# Patient Record
Sex: Male | Born: 1955 | Race: White | Hispanic: No | Marital: Married | Smoking: Never smoker
Health system: Southern US, Community
[De-identification: ages and names within clinical notes are randomized; demographics above are authoritative.]

## PROBLEM LIST (undated history)

## (undated) DIAGNOSIS — I10 Essential (primary) hypertension: Secondary | ICD-10-CM

## (undated) DIAGNOSIS — E785 Hyperlipidemia, unspecified: Secondary | ICD-10-CM

## (undated) DIAGNOSIS — E119 Type 2 diabetes mellitus without complications: Secondary | ICD-10-CM

## (undated) HISTORY — PX: JOINT REPLACEMENT: SHX530

---

## 1998-02-23 ENCOUNTER — Ambulatory Visit (HOSPITAL_COMMUNITY): Admission: RE | Admit: 1998-02-23 | Discharge: 1998-02-23 | Payer: Self-pay | Admitting: Endocrinology

## 1998-03-02 ENCOUNTER — Ambulatory Visit (HOSPITAL_COMMUNITY): Admission: RE | Admit: 1998-03-02 | Discharge: 1998-03-02 | Payer: Self-pay | Admitting: Endocrinology

## 2011-05-29 ENCOUNTER — Encounter: Payer: Self-pay | Admitting: Cardiovascular Disease

## 2015-03-28 ENCOUNTER — Emergency Department
Admission: EM | Admit: 2015-03-28 | Discharge: 2015-03-28 | Disposition: A | Discharge status: Home / Self Care | Payer: Commercial Managed Care - HMO | Attending: Emergency Medicine | Admitting: Emergency Medicine

## 2015-03-28 ENCOUNTER — Emergency Department: Payer: Commercial Managed Care - HMO

## 2015-03-28 DIAGNOSIS — Y92512 Supermarket, store or market as the place of occurrence of the external cause: Secondary | ICD-10-CM | POA: Insufficient documentation

## 2015-03-28 DIAGNOSIS — Y9389 Activity, other specified: Secondary | ICD-10-CM | POA: Insufficient documentation

## 2015-03-28 DIAGNOSIS — S3992XA Unspecified injury of lower back, initial encounter: Secondary | ICD-10-CM | POA: Diagnosis present

## 2015-03-28 DIAGNOSIS — W010XXA Fall on same level from slipping, tripping and stumbling without subsequent striking against object, initial encounter: Secondary | ICD-10-CM | POA: Insufficient documentation

## 2015-03-28 DIAGNOSIS — S80212A Abrasion, left knee, initial encounter: Secondary | ICD-10-CM | POA: Diagnosis not present

## 2015-03-28 DIAGNOSIS — S39012A Strain of muscle, fascia and tendon of lower back, initial encounter: Secondary | ICD-10-CM | POA: Insufficient documentation

## 2015-03-28 DIAGNOSIS — Y998 Other external cause status: Secondary | ICD-10-CM | POA: Insufficient documentation

## 2015-03-28 DIAGNOSIS — I10 Essential (primary) hypertension: Secondary | ICD-10-CM | POA: Diagnosis not present

## 2015-03-28 DIAGNOSIS — E119 Type 2 diabetes mellitus without complications: Secondary | ICD-10-CM | POA: Diagnosis not present

## 2015-03-28 DIAGNOSIS — M25462 Effusion, left knee: Secondary | ICD-10-CM

## 2015-03-28 HISTORY — DX: Hyperlipidemia, unspecified: E78.5

## 2015-03-28 HISTORY — DX: Type 2 diabetes mellitus without complications: E11.9

## 2015-03-28 HISTORY — DX: Essential (primary) hypertension: I10

## 2015-03-28 MED ORDER — TRAMADOL HCL 50 MG PO TABS
100.0000 mg | ORAL_TABLET | Freq: Four times a day (QID) | ORAL | Status: AC | PRN
Start: 2015-03-28 — End: 2016-03-27

## 2015-03-28 MED ORDER — TRAMADOL HCL 50 MG PO TABS
100.0000 mg | ORAL_TABLET | Freq: Once | ORAL | Status: AC
  Administered 2015-03-28: 100 mg via ORAL
  Filled 2015-03-28: qty 2

## 2015-03-28 NOTE — ED Provider Notes (Signed)
CSN: [id number]     Arrival date & time 03/28/15  0912 History   First MD Initiated Contact with Patient 03/28/15 [phone]     Chief Complaint  Patient presents with  . Fall    HPI Comments: 59 year old male presents today complaining of left knee and low back pain secondary to fall that occurred at [facility] just prior to arrival. Pt was transported here by EMS after slipping on what he thinks was lotion in the floor in [facility] and falling onto his left knee. He had a knee replacement on this knee in March at the [facility]. He is also having some back pain on the right side he attributes to the fall. He was able to get up on his own and stand after the fall without assistance.   Patient is a 59 y.o. male presenting with fall. The history is provided by the patient.  Fall This is a new problem. The current episode started today. The problem has been unchanged. Associated symptoms include arthralgias, joint swelling and myalgias. The symptoms are aggravated by standing and walking. He has tried nothing for the symptoms.    Past Medical History  Diagnosis Date  . Hypertension   . Diabetes mellitus without complication   . Hyperlipemia    Past Surgical History  Procedure Laterality Date  . Joint replacement      BL knee   No family history on file. Social History  Substance Use Topics  . Smoking status: Never Smoker   . Smokeless tobacco: Current User  . Alcohol Use: No    Review of Systems  Musculoskeletal: Positive for myalgias, joint swelling and arthralgias. Negative for gait problem.  Skin: Positive for wound.  All other systems reviewed and are negative.     Allergies  Bee pollen  Home Medications   Prior to Admission medications   Medication Sig Start Date End Date Taking? Authorizing Provider  traMADol (ULTRAM) 50 MG tablet Take 2 tablets (100 mg total) by mouth every 6 (six) hours as needed for moderate pain. 03/28/15 03/27/16  Wilber Oliphant V, PA-C   BP 144/81 mmHg  Pulse  65  Resp 18  Ht 6' (1.829 m)  Wt 294 lb (133.358 kg)  BMI 39.86 kg/m2  SpO2 96% Physical Exam  Constitutional: He is oriented to person, place, and time. Vital signs are normal. He appears well-developed and well-nourished.  HENT:  Head: Normocephalic and atraumatic.  Cardiovascular: Intact distal pulses.   Musculoskeletal: He exhibits tenderness.       Left hip: Normal.       Right knee: Normal.       Left knee: He exhibits decreased range of motion, swelling and effusion. He exhibits no deformity. Tenderness found. Medial joint line tenderness noted.       Left ankle: Normal.       Lumbar back: He exhibits tenderness and bony tenderness. He exhibits normal range of motion.  Diffuse lumbar spine tenderness and right lumbar paraspinal muscle tenderness.  Left knee TTP over patella and medial joint line  Neurological: He is alert and oriented to person, place, and time.  Skin: Skin is warm and dry.  Abrasion overlying left patella  Psychiatric: He has a normal mood and affect. His behavior is normal. Judgment and thought content normal.  Nursing note and vitals reviewed.   ED Course  Procedures (including critical care time) Labs Review Labs Reviewed - No data to display  Imaging Review Dg Lumbar Spine 2-3  Views  03/28/2015   CLINICAL DATA:  Fall.  Acute lumbago/low back pain.  EXAM: LUMBAR SPINE - 2-3 VIEW  COMPARISON:  None.  FINDINGS: Lumbosacral transitional anatomy is present. There is no compression fracture. Moderate multilevel lumbar degenerative disc disease. The alignment is within normal limits.  IMPRESSION: No acute osseous abnormality. Moderate lumbar degenerative disc disease.   Electronically Signed   By: Andreas Newport M.D.   On: 03/28/2015 10:17   Dg Knee Complete 4 Views Left  03/28/2015   CLINICAL DATA:  Acute left knee pain after fall in [employer]. Initial encounter.  EXAM: LEFT KNEE - COMPLETE 4+ VIEW  COMPARISON:  None.  FINDINGS: Status post left total knee  arthroplasty. The femoral and tibial components appear to be well situated. No fracture or dislocation is noted. Mild suprapatellar joint effusion is noted.  IMPRESSION: Status post left total knee arthroplasty. Mild suprapatellar joint effusion. No other significant abnormality seen in the left knee.   Electronically Signed   By: Lupita Raider, M.D.   On: 03/28/2015 10:15   I have personally reviewed and evaluated these images and lab results as part of my medical decision-making.   EKG Interpretation None      MDM  XRAYs normal - keep knee elevated, ice 15 minutes at a time. Short course of ultram, pt has taken this before for pain. Follow up with orthopedist at [facility] for persistent knee or back problems.  Final diagnoses:  Lumbar strain, initial encounter  Abrasion of left knee, initial encounter  Knee effusion, left       Wilber Oliphant V, PA-C 03/28/15 1111  Sharyn Creamer, MD 03/28/15 [phone]

## 2015-03-28 NOTE — ED Notes (Addendum)
Pt comes into the ED via EMS from [facility], states he slipped and fell due to lotion on the floor and is having left knee pain and lower back pain.the patient ambulatory with assist per EMS.the patient just had knee surgery in march.

## 2018-02-10 ENCOUNTER — Emergency Department
Admission: EM | Admit: 2018-02-10 | Discharge: 2018-02-11 | Disposition: A | Discharge status: Home / Self Care | Payer: No Typology Code available for payment source | Attending: Student in an Organized Health Care Education/Training Program | Admitting: Student in an Organized Health Care Education/Training Program

## 2018-02-10 ENCOUNTER — Other Ambulatory Visit: Payer: Self-pay

## 2018-02-10 ENCOUNTER — Encounter: Payer: Self-pay | Admitting: Radiology

## 2018-02-10 ENCOUNTER — Emergency Department: Payer: No Typology Code available for payment source

## 2018-02-10 DIAGNOSIS — R1032 Left lower quadrant pain: Secondary | ICD-10-CM | POA: Diagnosis present

## 2018-02-10 DIAGNOSIS — I10 Essential (primary) hypertension: Secondary | ICD-10-CM | POA: Insufficient documentation

## 2018-02-10 DIAGNOSIS — R17 Unspecified jaundice: Secondary | ICD-10-CM

## 2018-02-10 DIAGNOSIS — Z96652 Presence of left artificial knee joint: Secondary | ICD-10-CM | POA: Diagnosis not present

## 2018-02-10 DIAGNOSIS — F17228 Nicotine dependence, chewing tobacco, with other nicotine-induced disorders: Secondary | ICD-10-CM | POA: Insufficient documentation

## 2018-02-10 DIAGNOSIS — E119 Type 2 diabetes mellitus without complications: Secondary | ICD-10-CM | POA: Insufficient documentation

## 2018-02-10 DIAGNOSIS — R110 Nausea: Secondary | ICD-10-CM | POA: Diagnosis not present

## 2018-02-10 DIAGNOSIS — K8020 Calculus of gallbladder without cholecystitis without obstruction: Secondary | ICD-10-CM

## 2018-02-10 DIAGNOSIS — R109 Unspecified abdominal pain: Secondary | ICD-10-CM | POA: Diagnosis not present

## 2018-02-10 DIAGNOSIS — N201 Calculus of ureter: Secondary | ICD-10-CM

## 2018-02-10 DIAGNOSIS — Z96651 Presence of right artificial knee joint: Secondary | ICD-10-CM | POA: Diagnosis not present

## 2018-02-10 LAB — URINALYSIS, COMPLETE (UACMP) WITH MICROSCOPIC
Bacteria, UA: NONE SEEN
Bilirubin Urine: NEGATIVE
GLUCOSE, UA: NEGATIVE mg/dL
HGB URINE DIPSTICK: NEGATIVE
Ketones, ur: 5 mg/dL — AB
Leukocytes, UA: NEGATIVE
Nitrite: NEGATIVE
PH: 7 (ref 5.0–8.0)
Protein, ur: NEGATIVE mg/dL
SPECIFIC GRAVITY, URINE: 1.011 (ref 1.005–1.030)

## 2018-02-10 LAB — COMPREHENSIVE METABOLIC PANEL
ALK PHOS: 67 U/L (ref 38–126)
ALT: 29 U/L (ref 0–44)
AST: 26 U/L (ref 15–41)
Albumin: 4.6 g/dL (ref 3.5–5.0)
Anion gap: 9 (ref 5–15)
BILIRUBIN TOTAL: 1.4 mg/dL — AB (ref 0.3–1.2)
BUN: 17 mg/dL (ref 8–23)
CALCIUM: 10 mg/dL (ref 8.9–10.3)
CO2: 29 mmol/L (ref 22–32)
Chloride: 100 mmol/L (ref 98–111)
Creatinine, Ser: 2.17 mg/dL — ABNORMAL HIGH (ref 0.61–1.24)
GFR calc Af Amer: 36 mL/min — ABNORMAL LOW (ref >60)
GFR calc non Af Amer: 31 mL/min — ABNORMAL LOW (ref >60)
GLUCOSE: 149 mg/dL — AB (ref 70–99)
POTASSIUM: 3.8 mmol/L (ref 3.5–5.1)
Sodium: 138 mmol/L (ref 135–145)
TOTAL PROTEIN: 7.8 g/dL (ref 6.5–8.1)

## 2018-02-10 LAB — CBC
HEMATOCRIT: 45.5 % (ref 40.0–52.0)
HEMOGLOBIN: 15.6 g/dL (ref 13.0–18.0)
MCH: 31.4 pg (ref 26.0–34.0)
MCHC: 34.4 g/dL (ref 32.0–36.0)
MCV: 91.3 fL (ref 80.0–100.0)
Platelets: 210 10*3/uL (ref 150–440)
RBC: 4.98 MIL/uL (ref 4.40–5.90)
RDW: 13.1 % (ref 11.5–14.5)
WBC: 11.1 10*3/uL — AB (ref 3.8–10.6)

## 2018-02-10 MED ORDER — PROCHLORPERAZINE MALEATE 10 MG PO TABS: 10.0000 mg | ORAL_TABLET | Freq: Four times a day (QID) | ORAL | 0 refills | Status: AC | PRN

## 2018-02-10 MED ORDER — ONDANSETRON HCL 4 MG/2ML IJ SOLN
INTRAMUSCULAR | Status: AC
  Filled 2018-02-10: qty 2

## 2018-02-10 MED ORDER — TAMSULOSIN HCL 0.4 MG PO CAPS: 0.4000 mg | ORAL_CAPSULE | Freq: Every day | ORAL | 0 refills | Status: AC

## 2018-02-10 MED ORDER — HYDROCODONE-ACETAMINOPHEN 5-325 MG PO TABS: 1.0000 | ORAL_TABLET | ORAL | 0 refills | Status: AC | PRN

## 2018-02-10 MED ORDER — SODIUM CHLORIDE 0.9 % IV BOLUS
1000.0000 mL | Freq: Once | INTRAVENOUS | Status: AC
  Administered 2018-02-10: 1000 mL via INTRAVENOUS

## 2018-02-10 MED ORDER — FENTANYL CITRATE (PF) 100 MCG/2ML IJ SOLN
50.0000 ug | INTRAMUSCULAR | Status: DC | PRN
Start: 2018-02-10 — End: 2018-02-11

## 2018-02-10 MED ORDER — MORPHINE SULFATE (PF) 4 MG/ML IV SOLN
4.0000 mg | INTRAVENOUS | Status: DC | PRN
  Administered 2018-02-10: 4 mg via INTRAVENOUS
  Filled 2018-02-10: qty 1

## 2018-02-10 MED ORDER — KETOROLAC TROMETHAMINE 30 MG/ML IJ SOLN
15.0000 mg | Freq: Once | INTRAMUSCULAR | Status: AC
  Administered 2018-02-10: 15 mg via INTRAVENOUS

## 2018-02-10 MED ORDER — KETOROLAC TROMETHAMINE 30 MG/ML IJ SOLN
INTRAMUSCULAR | Status: AC
  Filled 2018-02-10: qty 1

## 2018-02-10 MED ORDER — ONDANSETRON HCL 4 MG/2ML IJ SOLN
4.0000 mg | Freq: Once | INTRAMUSCULAR | Status: AC
  Administered 2018-02-10: 4 mg via INTRAVENOUS

## 2018-02-10 MED ORDER — IOHEXOL 300 MG/ML  SOLN
75.0000 mL | Freq: Once | INTRAMUSCULAR | Status: AC | PRN
  Administered 2018-02-10: 75 mL via INTRAVENOUS
  Filled 2018-02-10: qty 75

## 2018-02-10 MED ORDER — TAMSULOSIN HCL 0.4 MG PO CAPS
0.4000 mg | ORAL_CAPSULE | Freq: Every day | ORAL | Status: DC
  Administered 2018-02-10: 0.4 mg via ORAL
  Filled 2018-02-10 (×2): qty 1

## 2018-02-10 NOTE — Discharge Instructions (Addendum)
Please follow call Dr. Heywood FootmanStoioff's (Urology) office in the AM for an appointment.  You should follow up with the general surgeon (Dr. Everlene FarrierPabon) as well, within the next 1-2 weeks.   You have been seen in the emergency department for emergency care. It is important that you contact your own doctor, specialist or the closest clinic for follow-up care. Please bring this instruction sheet, all medications and X-ray copies with you when you are seen for follow-up care.  Determining the exact cause for all patients with abdominal pain is extremely difficult in the emergency department. Our primary focus is to rule-out immediate life-threatening diseases. If no immediate source of pain is found the definitive diagnosis frequently needs to be determined over time.Many times your primary care physician can determine the cause by following the symptoms over time. Sometimes, specialist are required such as Gastroenterologists, Gynecologists, Urologists or Surgeons. Please return immediately to the Emergency Department for fever>101, Vomiting or Intractable Pain. You should return to the emergency department or see your primary care provider in 12-24hrs if your pain is no better and sooner if your pain becomes worse.

## 2018-02-10 NOTE — ED Triage Notes (Signed)
Pt in by [facility] to left flank area since yesterday. No hx of the same, denies any n.v.d. Or dysuria.

## 2018-02-10 NOTE — ED Notes (Signed)
Patient transported to CT via w/c at this time 

## 2018-02-10 NOTE — ED Notes (Addendum)
Pt c/o sudden abdominal pain that began yesterday. Pt tender to touch on middle left abdomen. Denies N/V. Reports constipation x 2+ days. Has tried milk of magnesia with no relief.

## 2018-02-10 NOTE — ED Provider Notes (Signed)
[facility] Emergency Department Provider Note    None    (approximate)  I have reviewed the triage vital signs and the nursing Lawrence.   HISTORY  Chief Complaint Abdominal Pain    HPI Bryce Lawrence history of diabetes, hyperlipidemia and hypertension presents with chief complaint of 1 day of progressively worsening moderate to severe left lower quadrant abdominal pain.  Does have history of constipation.  Denies any blood in stools.  Has had some associated nausea but no vomiting.  No measured fevers.  No pain radiating through to his back.  Is never had pain like this before.    Past Medical History:  Diagnosis Date  . Diabetes mellitus without complication (HCC)   . Hyperlipemia   . Hypertension    No family history on file. Past Surgical History:  Procedure Laterality Date  . JOINT REPLACEMENT     BL knee   There are no active problems to display for this patient.     Prior to Admission medications   Medication Sig Start Date End Date Taking? Authorizing Provider  HYDROcodone-acetaminophen (NORCO) 5-325 MG tablet Take 1 tablet by mouth every 4 (four) hours as needed for moderate pain. 02/10/18   Willy Eddyobinson, Gabreille Dardis, MD  prochlorperazine (COMPAZINE) 10 MG tablet Take 1 tablet (10 mg total) by mouth every 6 (six) hours as needed for nausea or vomiting. 02/10/18   Willy Eddyobinson, Stone Spirito, MD  tamsulosin (FLOMAX) 0.4 MG CAPS capsule Take 1 capsule (0.4 mg total) by mouth daily after supper. 02/10/18   Willy Eddyobinson, Elyana Grabski, MD    Allergies Bee pollen    Social History Social History   Tobacco Use  . Smoking status: Never Smoker  . Smokeless tobacco: Current User  Substance Use Topics  . Alcohol use: No  . Drug use: Not on file    Review of Systems Patient denies headaches, rhinorrhea, blurry vision, numbness, shortness of breath, chest pain, edema, cough, abdominal pain, nausea, vomiting, diarrhea, dysuria, fevers, rashes  or hallucinations unless otherwise stated above in HPI. ____________________________________________   PHYSICAL EXAM:  VITAL SIGNS: Vitals:   02/10/18 1947  BP: (!) 166/88  Pulse: 75  Resp: 18  Temp: 98.3 F (36.8 C)  SpO2: 96%    Constitutional: Alert and oriented.  Eyes: Conjunctivae are normal.  Head: Atraumatic. Nose: No congestion/rhinnorhea. Mouth/Throat: Mucous membranes are moist.   Neck: No stridor. Painless ROM.  Cardiovascular: Normal rate, regular rhythm. Grossly normal heart sounds.  Good peripheral circulation. Respiratory: Normal respiratory effort.  No retractions. Lungs CTAB. Gastrointestinal: Soft with tenderness palpation of left lower quadrant.  No guarding.  No distention. No abdominal bruits. No CVA tenderness. Genitourinary: deferred Musculoskeletal: No lower extremity tenderness nor edema.  No joint effusions. Neurologic:  Normal speech and language. No gross focal neurologic deficits are appreciated. No facial droop Skin:  Skin is warm, dry and intact. No rash noted. Psychiatric: Mood and affect are normal. Speech and behavior are normal.  ____________________________________________   LABS (all labs ordered are listed, but only abnormal results are displayed)  Results for orders placed or performed during the hospital encounter of 02/10/18 (from the past 24 hour(s))  CBC     Status: Abnormal   Collection Time: 02/10/18  7:49 PM  Result Value Ref Range   WBC 11.1 (H) 3.8 - 10.6 K/uL   RBC 4.98 4.40 - 5.90 MIL/uL   Hemoglobin 15.6 13.0 - 18.0 g/dL   HCT 16.145.5 09.640.0 - 04.552.0 %  MCV 91.3 80.0 - 100.0 fL   MCH 31.4 26.0 - 34.0 pg   MCHC 34.4 32.0 - 36.0 g/dL   RDW 16.1 09.6 - 04.5 %   Platelets 210 150 - 440 K/uL  Comprehensive metabolic panel     Status: Abnormal   Collection Time: 02/10/18  7:49 PM  Result Value Ref Range   Sodium 138 135 - 145 mmol/L   Potassium 3.8 3.5 - 5.1 mmol/L   Chloride 100 98 - 111 mmol/L   CO2 29 22 - 32 mmol/L    Glucose, Bld 149 (H) 70 - 99 mg/dL   BUN 17 8 - 23 mg/dL   Creatinine, Ser 4.09 (H) 0.61 - 1.24 mg/dL   Calcium 81.1 8.9 - 91.4 mg/dL   Total Protein 7.8 6.5 - 8.1 g/dL   Albumin 4.6 3.5 - 5.0 g/dL   AST 26 15 - 41 U/L   ALT 29 0 - 44 U/L   Alkaline Phosphatase 67 38 - 126 U/L   Total Bilirubin 1.4 (H) 0.3 - 1.2 mg/dL   GFR calc non Af Amer 31 (L) >60 mL/min   GFR calc Af Amer 36 (L) >60 mL/min   Anion gap 9 5 - 15  Urinalysis, Complete w Microscopic     Status: Abnormal   Collection Time: 02/10/18  7:49 PM  Result Value Ref Range   Color, Urine YELLOW (A) YELLOW   APPearance CLEAR (A) CLEAR   Specific Gravity, Urine 1.011 1.005 - 1.030   pH 7.0 5.0 - 8.0   Glucose, UA NEGATIVE NEGATIVE mg/dL   Hgb urine dipstick NEGATIVE NEGATIVE   Bilirubin Urine NEGATIVE NEGATIVE   Ketones, ur 5 (A) NEGATIVE mg/dL   Protein, ur NEGATIVE NEGATIVE mg/dL   Nitrite NEGATIVE NEGATIVE   Leukocytes, UA NEGATIVE NEGATIVE   RBC / HPF 0-5 0 - 5 RBC/hpf   WBC, UA 0-5 0 - 5 WBC/hpf   Bacteria, UA NONE SEEN NONE SEEN   Squamous Epithelial / LPF 0-5 0 - 5   ____________________________________________ ____________________________________________  RADIOLOGY  I personally reviewed all radiographic images ordered to evaluate for the above acute complaints and reviewed radiology reports and findings.  These findings were personally discussed with the patient.  Please see medical record for radiology report.  ____________________________________________   PROCEDURES  Procedure(s) performed:  Procedures    Critical Care performed: no ____________________________________________   INITIAL IMPRESSION / ASSESSMENT AND PLAN / ED COURSE  Pertinent labs & imaging results that were available during my care of the patient were reviewed by me and considered in my medical decision making (see chart for details).   DDX: Diverticulitis, colitis, perforation, stone, malignancy, hernia, musculoskeletal  strain, shingles  [name] is a 62 y.o. who presents to the ED with symptoms as described above.Patient is AFVSS in ED. Exam as above. Given current presentation have considered the above differential.  Does have mild leukocytosis and based on his pain and age and risk factors will order CT imaging to evaluate for the above differential.  Will provide IV fluids as well as IV pain medication and IV antiemetics.  Clinical Course as of Feb 11 2247  Tue Feb 10, 2018  2138 CT shows evidence of left hydronephrosis secondary to 1 cm obstructing ureteral stone.  No evidence of UTI or septic stone.  Also incidental finding of abnormal gallbladder anatomy and that will be further evaluated with ultrasound.   [PR]  2208 I discussed the case with Dr. Lonna Cobb of  urology who kindly agreed to look at CT imaging.  Would be a good candidate for lithotripsy.  Appropriate for Toradol.  Will give low-dose.  Patient receiving IV fluids.  Tolerating oral hydration.   [PR]  2245 Patient will be signed out to oncoming physician pending follow-up of ultrasound.  Patient will be stable and appropriate for discharge for follow-up with urology tomorrow pending normal right upper quadrant ultrasound. Have discussed with the patient and available family all diagnostics and treatments performed thus far and all questions were answered to the best of my ability. The patient demonstrates understanding and agreement with plan.    [PR]    Clinical Course User Index [PR] Willy Eddyobinson, Donato Studley, MD     As part of my medical decision making, I reviewed the following data within the electronic MEDICAL RECORD NUMBER Nursing Lawrence reviewed and incorporated, Labs reviewed, Lawrence from prior ED visits and [place] Controlled Substance Database   ____________________________________________   FINAL CLINICAL IMPRESSION(S) / ED DIAGNOSES  Final diagnoses:  Elevated bilirubin  Acute left flank pain  Ureterolithiasis      NEW  MEDICATIONS STARTED DURING THIS VISIT:  New Prescriptions   HYDROCODONE-ACETAMINOPHEN (NORCO) 5-325 MG TABLET    Take 1 tablet by mouth every 4 (four) hours as needed for moderate pain.   PROCHLORPERAZINE (COMPAZINE) 10 MG TABLET    Take 1 tablet (10 mg total) by mouth every 6 (six) hours as needed for nausea or vomiting.   TAMSULOSIN (FLOMAX) 0.4 MG CAPS CAPSULE    Take 1 capsule (0.4 mg total) by mouth daily after supper.     Note:  This document was prepared using Dragon voice recognition software and may include unintentional dictation errors.    Willy Eddyobinson, Jolleen Seman, MD 02/10/18 2248

## 2018-02-11 ENCOUNTER — Emergency Department: Payer: No Typology Code available for payment source

## 2018-02-11 NOTE — ED Provider Notes (Signed)
-----------------------------------------   1:50 AM on 02/11/2018 -----------------------------------------  I took over care of this patient from Dr. Roxan Hockeyobinson.  The patient was diagnosed with ureteral stone, but CT also showed findings concerning for cholelithiasis so an ultrasound was ordered.  The ultrasound does reveal a gallstone in the gallbladder neck, but no pericholecystic fluid or other acute abnormalities.  The patient has no right upper quadrant pain, no tenderness in this area.  Given that he has no elevated WBC count or any acute symptoms to suggest cholecystitis, there is no indication for emergent surgical consultation or admission.  He feels comfortable and would like to go home.  I informed the patient about these findings in addition to the ureteral stone.  I have provided referral to general surgery as well as to urology.  Return precautions given, and he expresses understanding.   Dionne BucySiadecki, Vale Peraza, MD 02/11/18 [phone]

## 2018-02-11 NOTE — ED Notes (Signed)
Patient transported to Ultrasound 

## 2018-02-16 ENCOUNTER — Telehealth: Payer: Self-pay | Admitting: Radiology

## 2018-02-16 NOTE — Telephone Encounter (Signed)
-----   Message from Scott C Stoioff, MD sent at 02/11/2018 12:50 PM EDT ----- This was the patient for possible lithotripsy tomorrow.  You can give him a call and see if he is interested in scheduling. 

## 2018-02-16 NOTE — Telephone Encounter (Signed)
-----   Message from Riki AltesScott C Stoioff, MD sent at 02/11/2018 12:50 PM EDT ----- This was the patient for possible lithotripsy tomorrow.  You can give him a call and see if he is interested in scheduling.

## 2018-02-16 NOTE — Telephone Encounter (Signed)
Spoke with patient's wife who states stone has been treated at the [facility].

## 2019-05-21 IMAGING — US US ABDOMEN LIMITED
1 series · 14 of 25 positions shown · non-contrast
Comparison: CT from the previous day.

CLINICAL DATA: Elevated bilirubin and abnormal CT examination

EXAM:
ULTRASOUND ABDOMEN LIMITED RIGHT UPPER QUADRANT

[Series 1: us abdomen limited · 0.30mm/px · 14 of 33 slices shown]
[im 1/33]
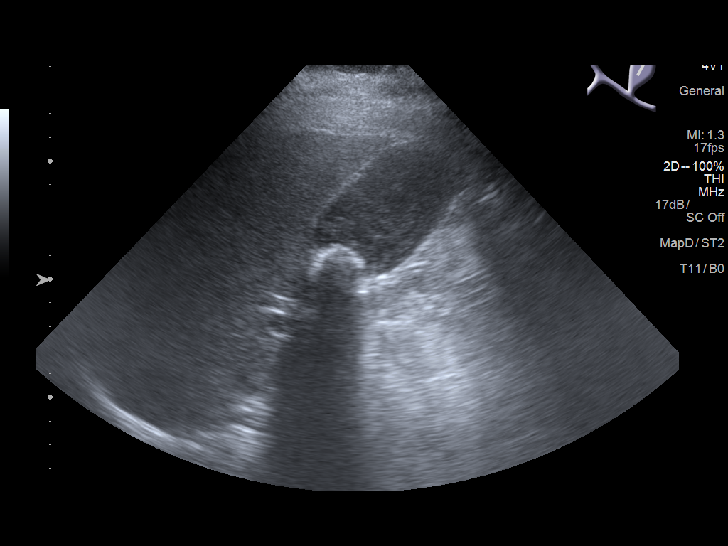
[im 3/33]
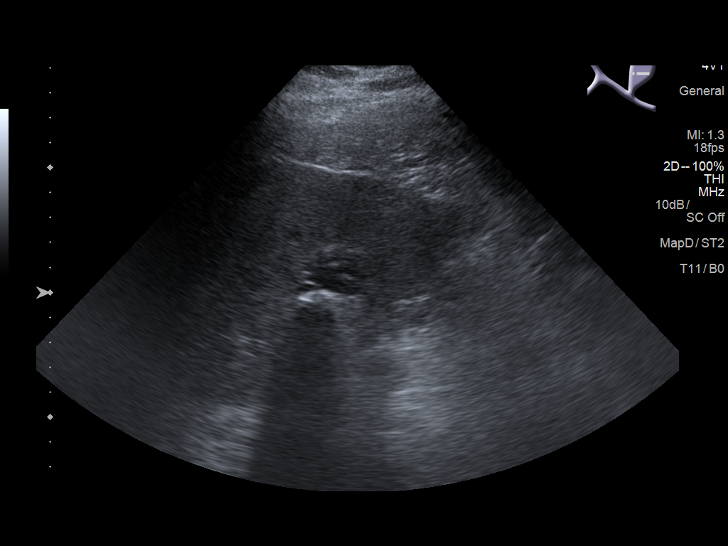
[im 6/33]
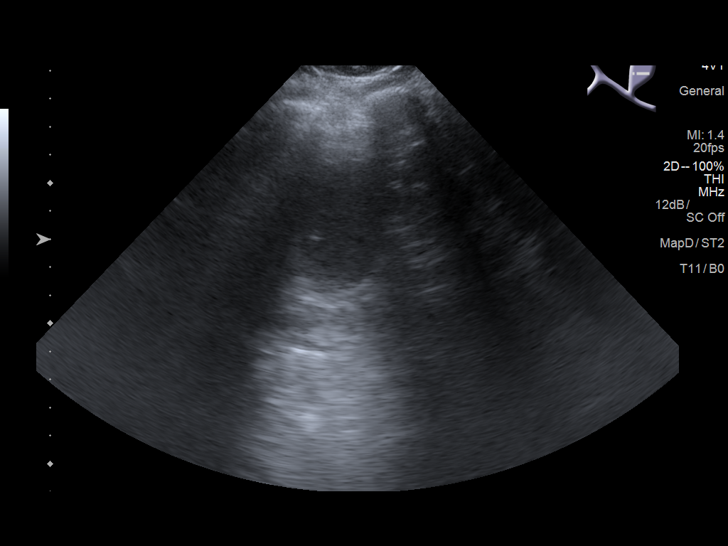
[im 9/33]
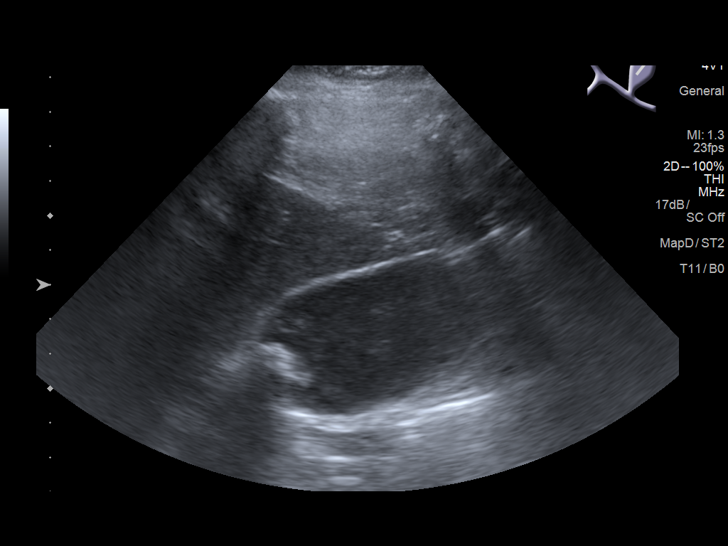
[im 11/33]
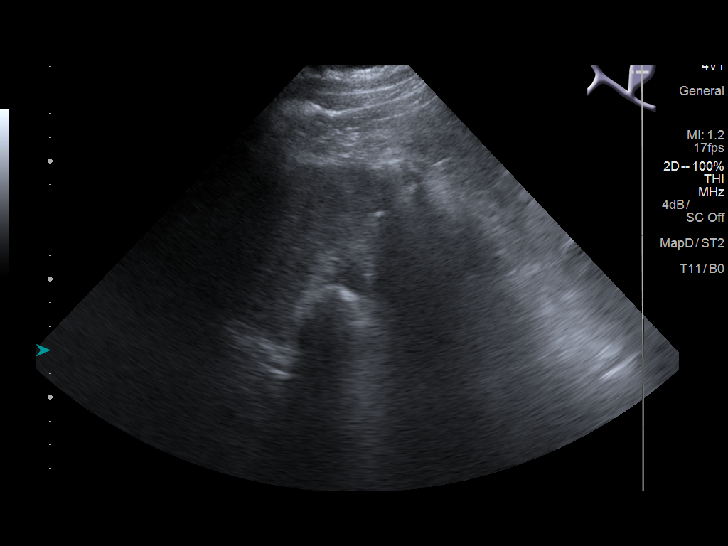
[im 13/33]
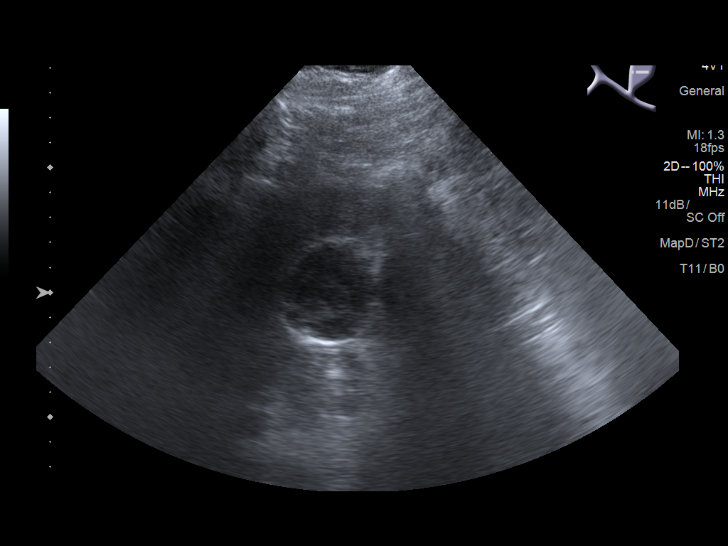
[im 15/33]
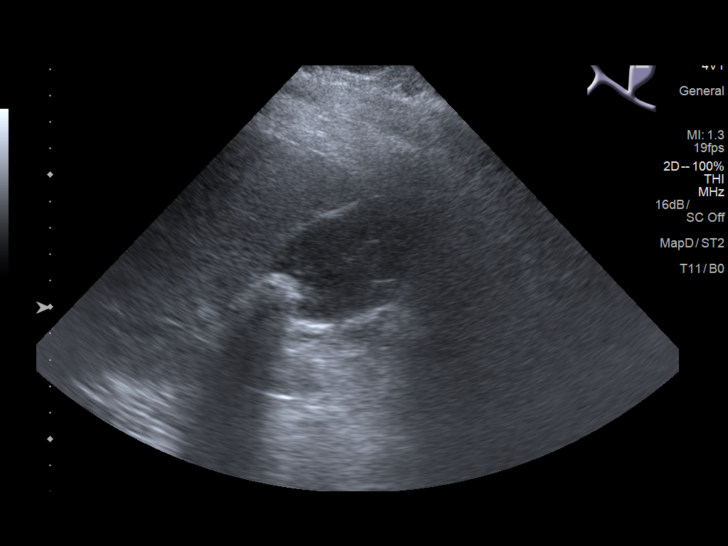
[im 18/33]
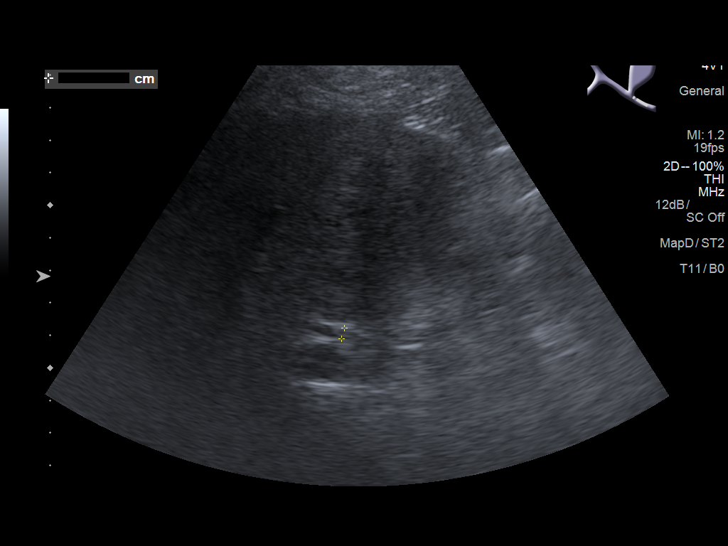
[im 21/33]
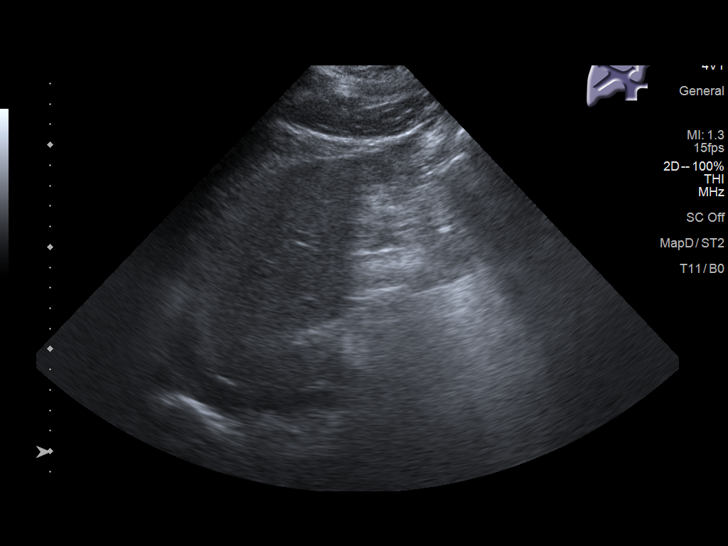
[im 22/33]
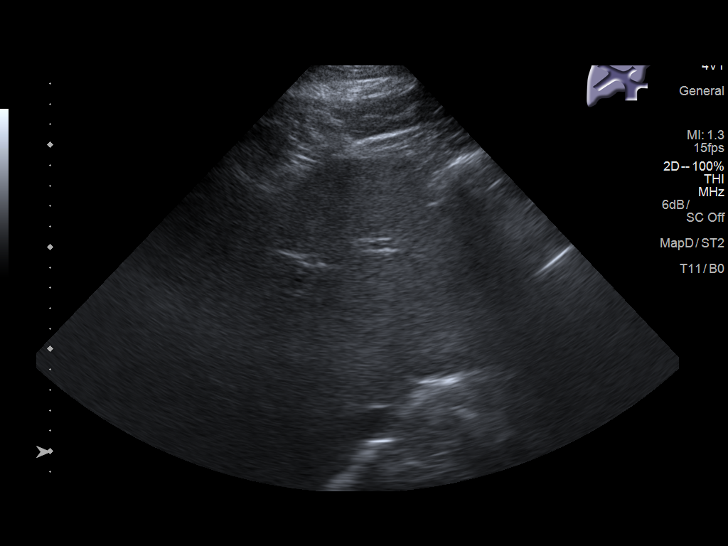
[im 25/33]
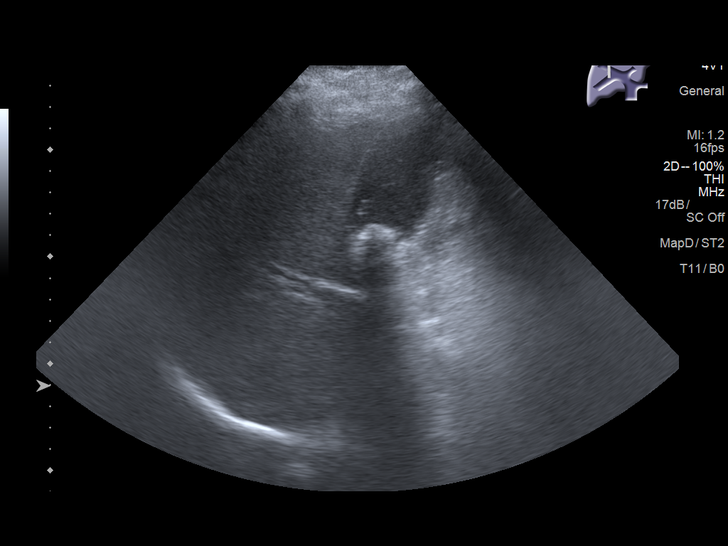
[im 27/33]
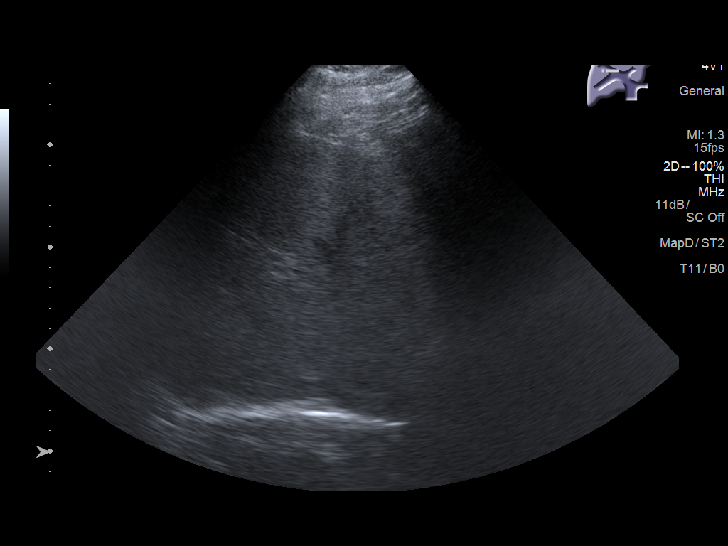
[im 30/33]
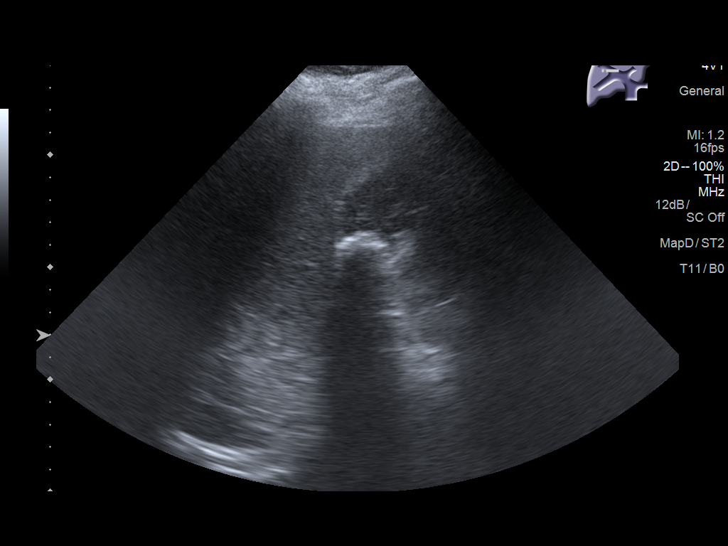
[im 33/33]
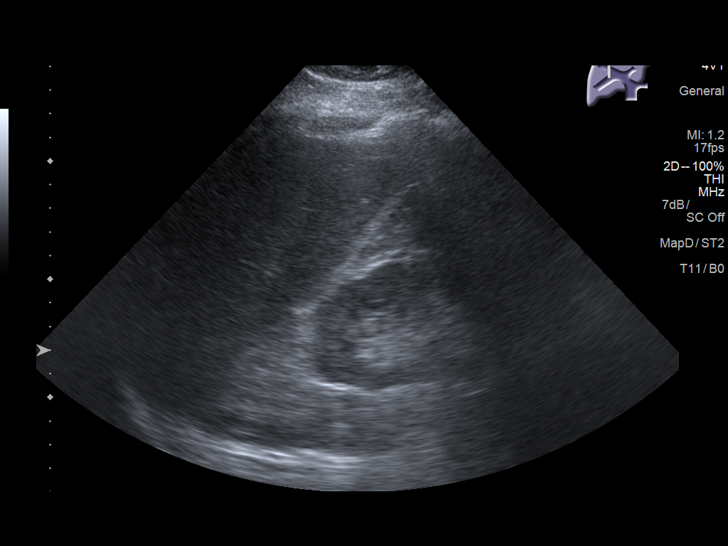

[14 of 25 positions shown; findings below may reference images not displayed]

FINDINGS: Gallbladder:

Gallbladder is well distended and demonstrates a large 2.6 cm stone
lodged in the neck. Gallbladder is filled with tumefactive sludge.
These changes are similar to that noted on recent CT examination.
Negative sonographic Murphy sign is noted. Some questionable wall
thickening is noted. No pericholecystic fluid is seen.

Common bile duct:

Diameter: 3.4 mm.

Liver:

No focal lesion identified. Within normal limits in parenchymal
echogenicity. Portal vein is patent on color Doppler imaging with
normal direction of blood flow towards the liver.
IMPRESSION: Gallbladder stone lodged within the neck with tumefactive sludge in
the remainder of the gallbladder. This is similar to that seen on
recent CT examination.

## 2019-11-24 IMAGING — CT CT ABD-PELV W/ CM
2 of 5 series · 15 of 46 positions shown, 17 images · IV contrast (APPLIED)
Comparison: None.

CLINICAL DATA: Sided abdominal pain

EXAM:
CT ABDOMEN AND PELVIS WITH CONTRAST
TECHNIQUE: Multidetector CT imaging of the abdomen and pelvis was performed
using the standard protocol following bolus administration of
intravenous contrast.
CONTRAST:  75mL OMNIPAQUE IOHEXOL 300 MG/ML  SOLN

[Series 2: routine abd/pel with · axial · 0.85mm/px · z∈[-1132,-632]mm · 12 of 112 slices shown, 14 images]
[im 6/112  soft-tissue]
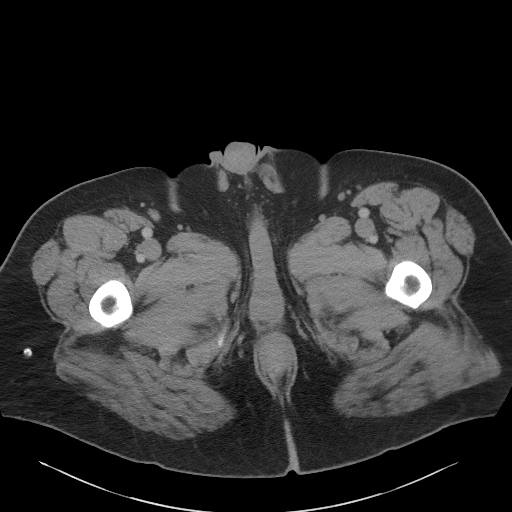
[im 6/112  bone]
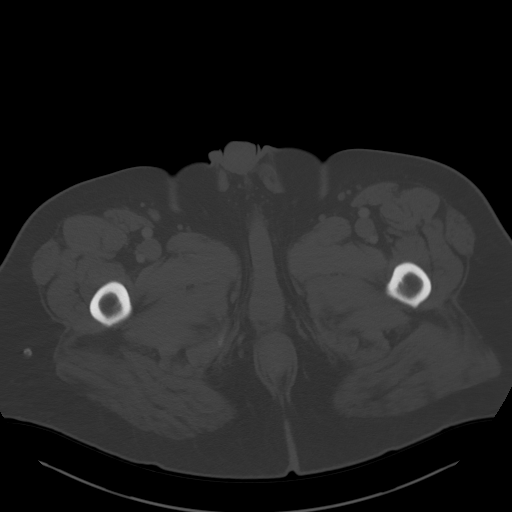
[im 18/112  soft-tissue]
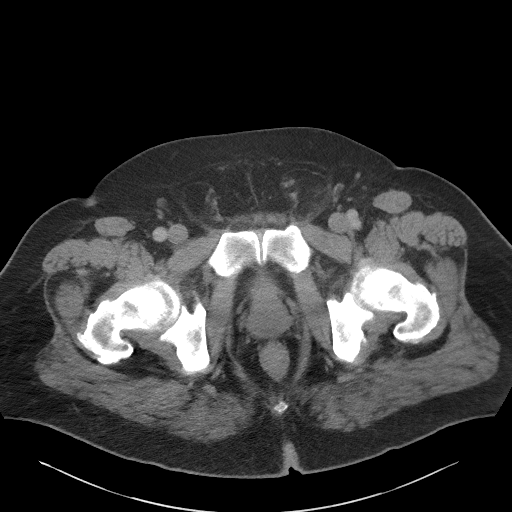
[im 24/112  soft-tissue]
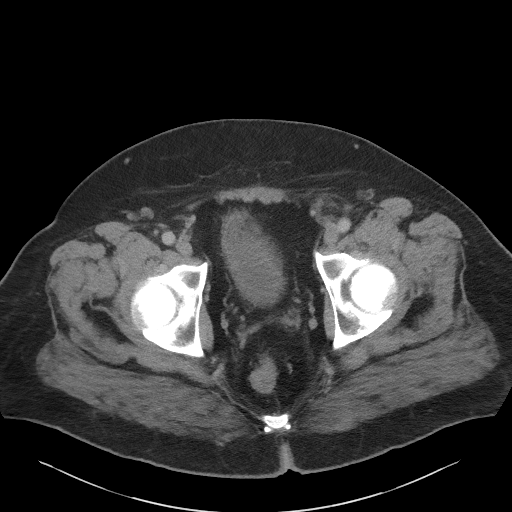
[im 36/112  soft-tissue]
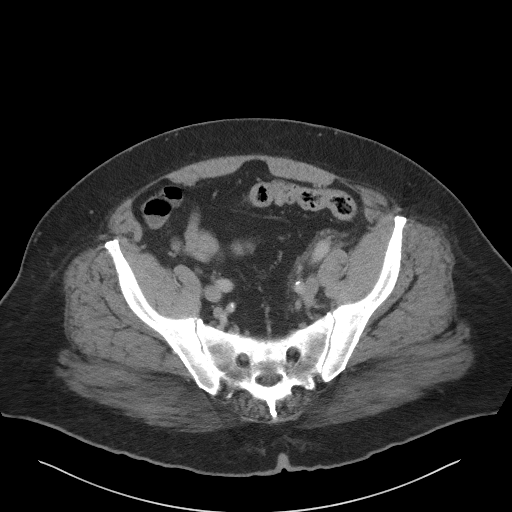
[im 41/112  soft-tissue]
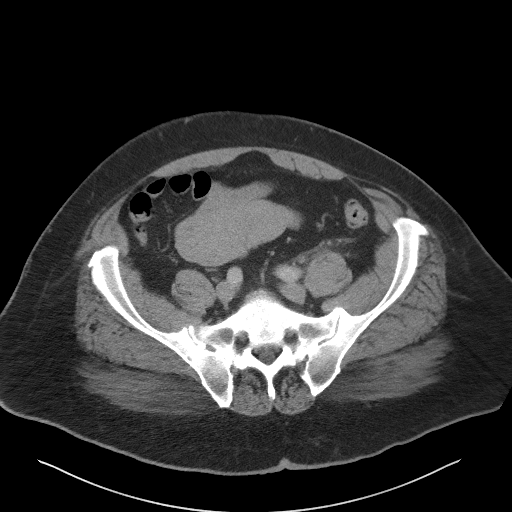
[im 53/112  soft-tissue]
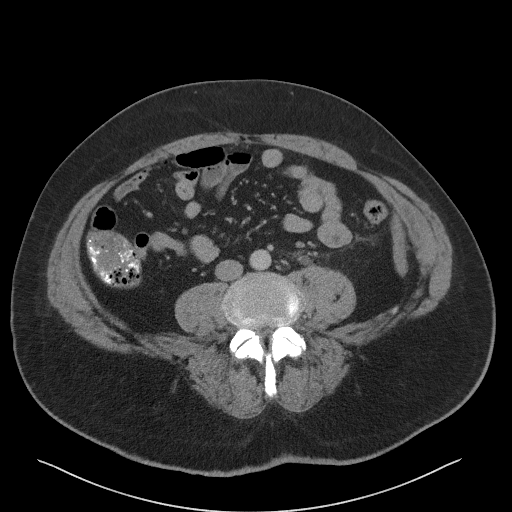
[im 59/112  soft-tissue]
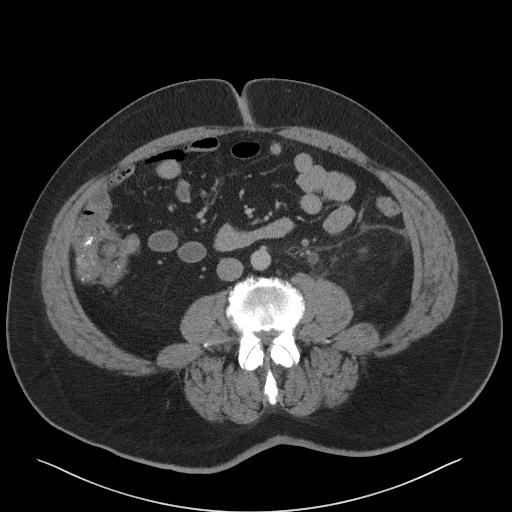
[im 71/112  soft-tissue]
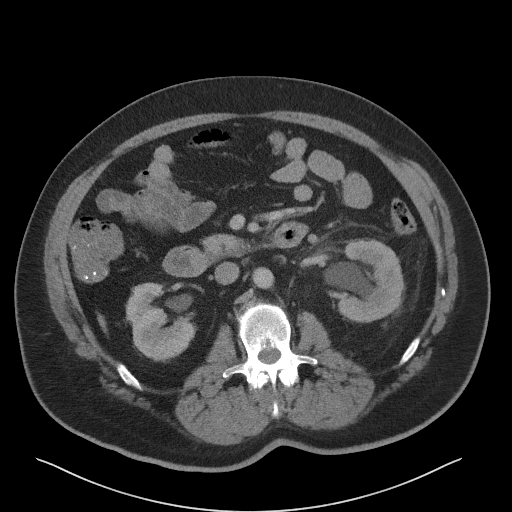
[im 76/112  soft-tissue]
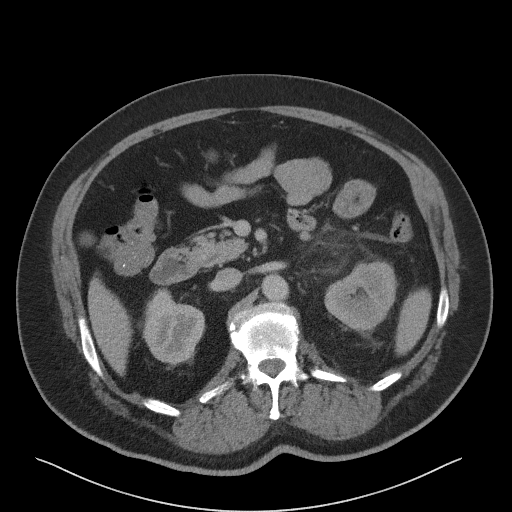
[im 76/112  bone]
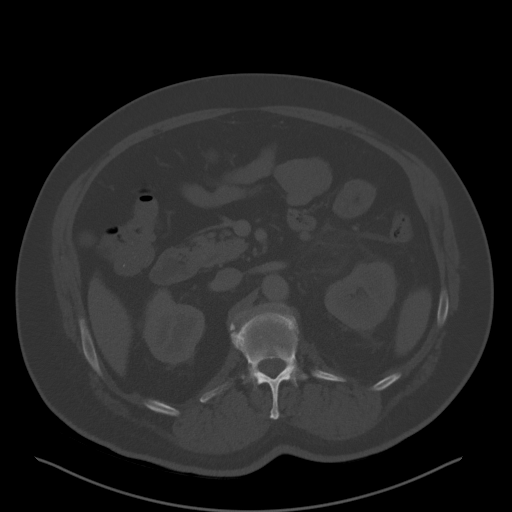
[im 88/112  soft-tissue]
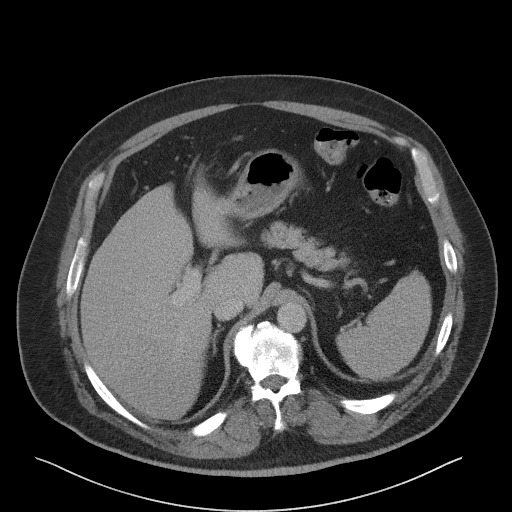
[im 94/112  soft-tissue]
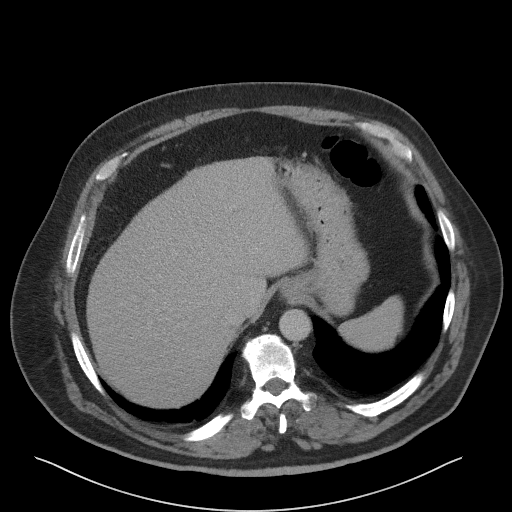
[im 106/112  soft-tissue]
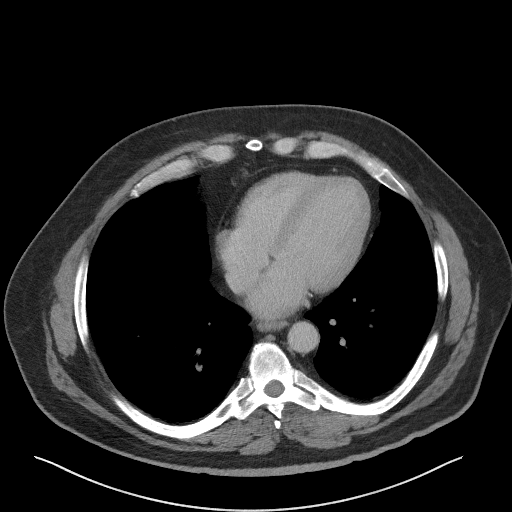

[Series 5: coronal st · coronal · 0.77mm/px · 3 of 112 slices shown]
[im 38/112  soft-tissue]
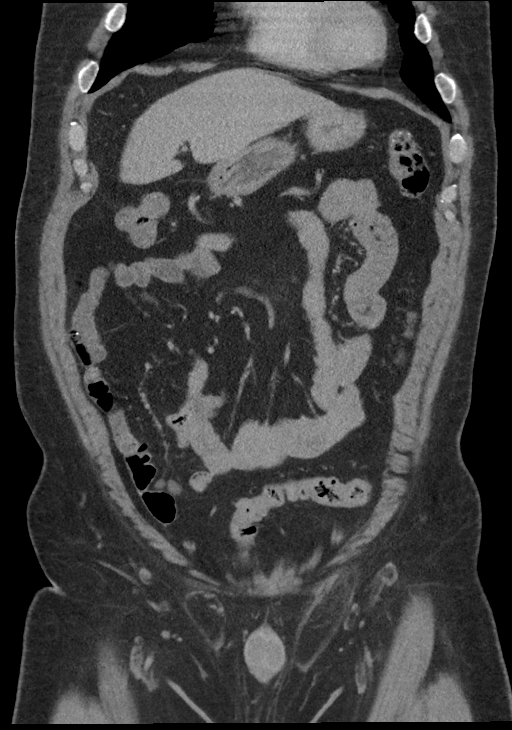
[im 50/112  soft-tissue]
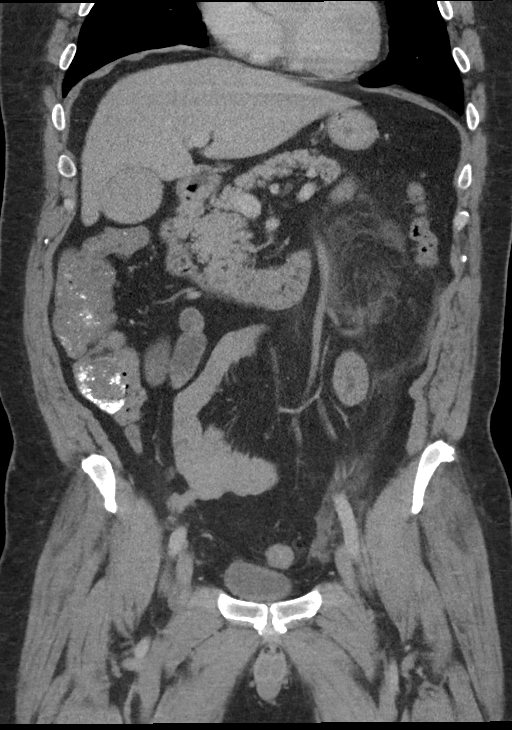
[im 62/112  soft-tissue]
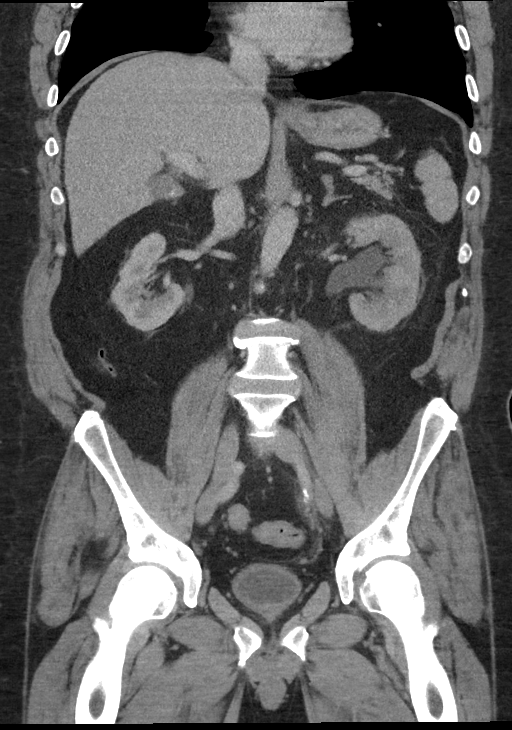

[15 of 46 positions shown; findings below may reference images not displayed]

FINDINGS: Lower chest: No acute abnormality.

Hepatobiliary: Liver is within normal limits. The gallbladder is
well distended and demonstrates some hyperdense material in a
nondependent location of the gallbladder. A rounded area of
decreased attenuation is noted best seen on image number 29 of
series 2 which may represent a poorly calcified stone and some
obstructive change with hyperdense sludge.

Pancreas: Unremarkable. No pancreatic ductal dilatation or
surrounding inflammatory changes.

Spleen: Normal in size without focal abnormality.

Adrenals/Urinary Tract: Adrenal glands are within normal limits. The
right kidney is within normal limits. No obstructive changes are
seen. The bladder is decompressed. On the left, the kidney
demonstrates hydronephrosis and hydroureter secondary to a 10 mm
left mid ureteral stone. Perinephric stranding and periureteral
stranding is seen consistent with the obstructive change.

Stomach/Bowel: Stomach is within normal limits. Appendix appears
normal. No evidence of bowel wall thickening, distention, or
inflammatory changes.

Vascular/Lymphatic: No significant vascular findings are present. No
enlarged abdominal or pelvic lymph nodes.

Reproductive: Prostate is unremarkable.

Other: Bilateral inguinal hernias are noted which are fat containing
left slightly greater than right.

Musculoskeletal: Degenerative changes of lumbar spine are seen.
IMPRESSION: Left mid ureteral stone with obstructive change as described.

Dense material within the non dependent gallbladder with what
appears to be a noncalcified stone lodged within the gallbladder
neck. Ultrasound may be helpful for further evaluation during the
current workup.

Bilateral inguinal hernias as described.
